# Patient Record
Sex: Female | Born: 1950 | Hispanic: No | Marital: Married | Smoking: Never smoker
Health system: Southern US, Community
[De-identification: ages and names within clinical notes are randomized; demographics above are authoritative.]

## PROBLEM LIST (undated history)

## (undated) DIAGNOSIS — E7800 Pure hypercholesterolemia, unspecified: Secondary | ICD-10-CM

## (undated) DIAGNOSIS — I10 Essential (primary) hypertension: Secondary | ICD-10-CM

---

## 1998-02-18 ENCOUNTER — Ambulatory Visit (HOSPITAL_COMMUNITY): Admission: RE | Admit: 1998-02-18 | Discharge: 1998-02-18 | Payer: Self-pay | Admitting: Cardiovascular Disease

## 2010-07-09 ENCOUNTER — Ambulatory Visit: Payer: Self-pay | Admitting: Obstetrics & Gynecology

## 2010-07-16 ENCOUNTER — Ambulatory Visit: Payer: Self-pay | Admitting: Obstetrics and Gynecology

## 2010-08-13 ENCOUNTER — Ambulatory Visit
Admission: RE | Admit: 2010-08-13 | Discharge: 2010-08-13 | Payer: Self-pay | Source: Home / Self Care | Attending: Obstetrics and Gynecology | Admitting: Obstetrics and Gynecology

## 2010-08-14 NOTE — Progress Notes (Unsigned)
NAME:  LITSY, EPTING NO.:  [id number]  MEDICAL RECORD NO.:  [id number]          PATIENT TYPE:  WOC  LOCATION:  [facility]                   FACILITY:  [facility]  PHYSICIAN:  Argentina Donovan, MD        DATE OF BIRTH:  Apr 05, 1951  DATE OF SERVICE:  08/13/2010                                 CLINIC NOTE  The patient is a 60 year old [place] lady speaks English, although there is an interpreter present, who was in 4 weeks ago with postmenopausal bleeding, had an IUD removed without incident and since then, she has had no bleeding, so were not going to do anything followup.  I think we will have her come back in 6 months for a followup exam, perhaps repeat ultrasound and then determine whether or not endometrial biopsy is necessary.  For the moment, I think we will hold off on doing that.  I see no reason to put the lady through this at this point.  IMPRESSION:  Postmenopausal bleeding prior to intrauterine device removal.          ______________________________ Argentina Donovan, MD    PR/MEDQ  D:  08/13/2010  T:  08/14/2010  Job:  [id number]

## 2011-07-31 ENCOUNTER — Other Ambulatory Visit: Payer: Self-pay

## 2011-07-31 ENCOUNTER — Emergency Department (HOSPITAL_BASED_OUTPATIENT_CLINIC_OR_DEPARTMENT_OTHER)
Admission: EM | Admit: 2011-07-31 | Discharge: 2011-07-31 | Disposition: A | Discharge status: Home / Self Care | Payer: Self-pay | Attending: Emergency Medicine | Admitting: Emergency Medicine

## 2011-07-31 ENCOUNTER — Encounter: Payer: Self-pay | Admitting: *Deleted

## 2011-07-31 ENCOUNTER — Emergency Department (INDEPENDENT_AMBULATORY_CARE_PROVIDER_SITE_OTHER): Payer: Self-pay

## 2011-07-31 DIAGNOSIS — R918 Other nonspecific abnormal finding of lung field: Secondary | ICD-10-CM

## 2011-07-31 DIAGNOSIS — R071 Chest pain on breathing: Secondary | ICD-10-CM | POA: Insufficient documentation

## 2011-07-31 DIAGNOSIS — R0789 Other chest pain: Secondary | ICD-10-CM

## 2011-07-31 DIAGNOSIS — E7800 Pure hypercholesterolemia, unspecified: Secondary | ICD-10-CM | POA: Insufficient documentation

## 2011-07-31 DIAGNOSIS — J45909 Unspecified asthma, uncomplicated: Secondary | ICD-10-CM | POA: Insufficient documentation

## 2011-07-31 DIAGNOSIS — E119 Type 2 diabetes mellitus without complications: Secondary | ICD-10-CM | POA: Insufficient documentation

## 2011-07-31 DIAGNOSIS — I10 Essential (primary) hypertension: Secondary | ICD-10-CM | POA: Insufficient documentation

## 2011-07-31 DIAGNOSIS — R079 Chest pain, unspecified: Secondary | ICD-10-CM

## 2011-07-31 DIAGNOSIS — Z79899 Other long term (current) drug therapy: Secondary | ICD-10-CM | POA: Insufficient documentation

## 2011-07-31 DIAGNOSIS — J40 Bronchitis, not specified as acute or chronic: Secondary | ICD-10-CM | POA: Insufficient documentation

## 2011-07-31 HISTORY — DX: Pure hypercholesterolemia, unspecified: E78.00

## 2011-07-31 HISTORY — DX: Essential (primary) hypertension: I10

## 2011-07-31 LAB — BASIC METABOLIC PANEL
Chloride: 97 mEq/L (ref 96–112)
Creatinine, Ser: 0.4 mg/dL — ABNORMAL LOW (ref 0.50–1.10)
GFR calc Af Amer: >90 mL/min (ref >90)
GFR calc non Af Amer: >90 mL/min (ref >90)
Potassium: 3.9 mEq/L (ref 3.5–5.1)

## 2011-07-31 LAB — CBC
MCHC: 31.8 g/dL (ref 30.0–36.0)
Platelets: 214 10*3/uL (ref 150–400)
RDW: 14.3 % (ref 11.5–15.5)
WBC: 10.9 10*3/uL — ABNORMAL HIGH (ref 4.0–10.5)

## 2011-07-31 LAB — CARDIAC PANEL(CRET KIN+CKTOT+MB+TROPI)
Relative Index: 2.1 (ref 0.0–2.5)
Total CK: 123 U/L (ref 7–177)

## 2011-07-31 MED ORDER — NAPROXEN 250 MG PO TABS
500.0000 mg | ORAL_TABLET | Freq: Once | ORAL | Status: AC
  Administered 2011-07-31: 500 mg via ORAL
  Filled 2011-07-31: qty 2

## 2011-07-31 MED ORDER — ALBUTEROL SULFATE HFA 108 (90 BASE) MCG/ACT IN AERS
2.0000 | INHALATION_SPRAY | RESPIRATORY_TRACT | Status: DC | PRN
  Administered 2011-07-31: 2 via RESPIRATORY_TRACT
  Filled 2011-07-31: qty 6.7

## 2011-07-31 MED ORDER — PROMETHAZINE-CODEINE 6.25-10 MG/5ML PO SYRP: 5.0000 mL | ORAL_SOLUTION | ORAL | Status: AC | PRN

## 2011-07-31 MED ORDER — NAPROXEN SODIUM 220 MG PO TABS: ORAL_TABLET | ORAL | Status: AC

## 2011-07-31 NOTE — ED Notes (Signed)
Pt presents to ED today with chest pressure that started about 1 week ago.  Pt describes as presssure and non-radiating in nature.

## 2011-07-31 NOTE — ED Provider Notes (Signed)
History     CSN: [id number]  Arrival date & time 07/31/11  0012   First MD Initiated Contact with Patient 07/31/11 [phone]      Chief Complaint  Patient presents with  . Chest Pain    (Consider location/radiation/quality/duration/timing/severity/associated sxs/prior treatment) HPI This is a 61 year old female with a one-week history of chest discomfort. The pain is mild to moderate, described as pressure, and is located in the left parasternal region. It is worse with movement and certain positions. It is worse with palpation. She has had some mild dyspnea over the past week. She did have cough earlier in the week but that has improved. She denies nausea, vomiting, diarrhea or abdominal pain. She denies fever. Her sugar was noted to be elevated but she states she did not take her evening Lantus dose. She has an appointment with her primary care physician the day after tomorrow. The only thing she is taking is 281 mg aspirin tablets, without relief.  Past Medical History  Diagnosis Date  . Diabetes mellitus   . Hypertension   . Asthma   . Hypercholesteremia     History reviewed. No pertinent past surgical history.  History reviewed. No pertinent family history.  History  Substance Use Topics  . Smoking status: Never Smoker   . Smokeless tobacco: Not on file  . Alcohol Use: No    OB History    Grav Para Term Preterm Abortions TAB SAB Ect Mult Living                  Review of Systems  All other systems reviewed and are negative.    Allergies  Review of patient's allergies indicates no known allergies.  Home Medications   Current Outpatient Rx  Name Route Sig Dispense Refill  . ALBUTEROL SULFATE HFA 108 (90 BASE) MCG/ACT IN AERS Inhalation Inhale 2 puffs into the lungs every 6 (six) hours as needed.      Marland Kitchen AMLODIPINE BESYLATE 10 MG PO TABS Oral Take 10 mg by mouth daily.      . DEXLANSOPRAZOLE 60 MG PO CPDR Oral Take 60 mg by mouth daily.      Marland Kitchen  FLUTICASONE-SALMETEROL 250-50 MCG/DOSE IN AEPB Inhalation Inhale 1 puff into the lungs every 12 (twelve) hours.      Marland Kitchen GLIMEPIRIDE 4 MG PO TABS Oral Take 4 mg by mouth daily before breakfast.      . MONTELUKAST SODIUM 10 MG PO TABS Oral Take 10 mg by mouth at bedtime.      Marland Kitchen OLMESARTAN MEDOXOMIL-HCTZ 20-12.5 MG PO TABS Oral Take 1 tablet by mouth daily.      Marland Kitchen SIMVASTATIN 20 MG PO TABS Oral Take 20 mg by mouth every evening.        BP 157/88  Pulse 79  Temp(Src) 97.5 F (36.4 C) (Oral)  Resp 19  SpO2 94%  Physical Exam General: Well-developed, well-nourished female in no acute distress; appearance consistent with age of record HENT: normocephalic, atraumatic Eyes: Normal per Neck: supple Heart: regular rate and rhythm; no murmurs Lungs: Faint expiratory wheezing in the bases; decreased air movement Chest: Left parasternal chest tenderness without crepitus or deformity Abdomen: soft; nondistended Extremities: No deformity; full range of motion; pulses normal Neurologic: Awake, alert; motor function intact in all extremities and symmetric; no facial droop Skin: Warm and dry Psychiatric: Normal mood and affect    ED Course  Procedures (including critical care time)    MDM   Nursing notes and vitals  signs, including pulse oximetry, reviewed.  Summary of this visit's results, reviewed by myself:  Labs:  Results for orders placed during the hospital encounter of 07/31/11  CBC      Component Value Range   WBC 10.9 (*) 4.0 - 10.5 (K/uL)   RBC 4.76  3.87 - 5.11 (MIL/uL)   Hemoglobin 12.3  12.0 - 15.0 (g/dL)   HCT 40.9  81.1 - 91.4 (%)   MCV 81.3  78.0 - 100.0 (fL)   MCH 25.8 (*) 26.0 - 34.0 (pg)   MCHC 31.8  30.0 - 36.0 (g/dL)   RDW 78.2  95.6 - 21.3 (%)   Platelets 214  150 - 400 (K/uL)  BASIC METABOLIC PANEL      Component Value Range   Sodium 135  135 - 145 (mEq/L)   Potassium 3.9  3.5 - 5.1 (mEq/L)   Chloride 97  96 - 112 (mEq/L)   CO2 25  19 - 32 (mEq/L)    Glucose, Bld 410 (*) 70 - 99 (mg/dL)   BUN 13  6 - 23 (mg/dL)   Creatinine, Ser 0.86 (*) 0.50 - 1.10 (mg/dL)   Calcium 9.6  8.4 - 57.8 (mg/dL)   GFR calc non Af Amer >90  >90 (mL/min)   GFR calc Af Amer >90  >90 (mL/min)  CARDIAC PANEL(CRET KIN+CKTOT+MB+TROPI)      Component Value Range   Total CK 123  7 - 177 (U/L)   CK, MB 2.6  0.3 - 4.0 (ng/mL)   Troponin I <0.30  <0.30 (ng/mL)   Relative Index 2.1  0.0 - 2.5     Imaging Studies: Dg Chest 2 View  07/31/2011  *RADIOLOGY REPORT*  Clinical Data: Chest pain/chest pressure.  CHEST - 2 VIEW  Comparison: None.  Findings: Hypoaeration results in mild interstitial and vascular crowding.  Eventration versus elevation right hemidiaphragm. Mild central peribronchial cuffing.  No focal areas of consolidation. No pleural effusion or pneumothorax.  Aortic arch atherosclerotic calcification.  Heart size upper normal limits.  No acute osseous abnormality.  IMPRESSION: Hypoaeration results in mild interstitial and vascular crowding.  Mild central peribronchial cuffing.  No focal areas of consolidation.  Original Report Authenticated By: Waneta Martins, M.D.   EKG Interpretation:  Date & Time: 07/31/2011 12:33 AM  Rate: 78  Rhythm: normal sinus rhythm  QRS Axis: right  Intervals: normal  ST/T Wave abnormalities: normal  Conduction Disutrbances:none  Narrative Interpretation: poor r-wave progression  Old EKG Reviewed: none available   2:49 AM We'll treat for bronchitis and chest wall pain. As noted she has an appointment with her PCP the day after tomorrow.             Hanley Seamen, MD 07/31/11 [phone]

## 2012-02-07 ENCOUNTER — Encounter: Payer: Self-pay | Admitting: Obstetrics & Gynecology

## 2012-03-13 ENCOUNTER — Ambulatory Visit (INDEPENDENT_AMBULATORY_CARE_PROVIDER_SITE_OTHER): Payer: Self-pay | Admitting: Obstetrics & Gynecology

## 2012-03-13 ENCOUNTER — Encounter: Payer: Self-pay | Admitting: Obstetrics & Gynecology

## 2012-03-13 VITALS — BP 109/63 | HR 63 | Temp 96.9°F | Ht 64.0 in | Wt 217.7 lb

## 2012-03-13 DIAGNOSIS — N898 Other specified noninflammatory disorders of vagina: Secondary | ICD-10-CM

## 2012-03-13 DIAGNOSIS — N950 Postmenopausal bleeding: Secondary | ICD-10-CM

## 2012-03-13 NOTE — Patient Instructions (Signed)
Postmenopausal Bleeding Menopause is commonly referred to as the "change in life." It is a time when the fertile years, the time of ovulating and having menstrual periods, has come to an end. It is also determined by not having menstrual periods for 12 months.  Postmenopausal bleeding is any bleeding a woman has after she has entered into menopause. Any type of postmenopausal bleeding, even if it appears to be a typical menstrual period, is concerning. This should be evaluated by your caregiver.  CAUSES   Hormone therapy.   Cancer of the cervix or cancer of the lining of the uterus (endometrial cancer).   Thinning of the uterine lining (uterine atrophy).   Thyroid diseases.   Certain medicines.   Infection of the uterus or cervix.   Inflammation or irritation of the uterine lining (endometritis).   Estrogen-secreting tumors.   Growths (polyps) on the cervix, uterine lining, or uterus.   Uterine tumors (fibroids).   Being very overweight (obese).  DIAGNOSIS  Your caregiver will take a medical history and ask questions. A physical exam will also be performed. Further tests may include:   A transvaginal ultrasound. An ultrasound wand or probe is inserted into your vagina to view the pelvic organs.   A biopsy of the lining of the uterus (endometrium). A sample of the endometrium is removed and examined.   A hysteroscopy. Your caregiver may use an instrument with a light and a camera attached to it (hysteroscope). The hysteroscope is used to look inside the uterus for problems.   A dilation and curettage (D&C). Tissue is removed from the uterine lining to be examined for problems.  TREATMENT  Treatment depends on the cause of the bleeding. Some treatments include:   Surgery.   Medicines.   Hormones.   A hysteroscopy or D&C to remove polyps or fibroids.   Changing or stopping a current medicine you are taking.  Talk to your caregiver about your specific treatment. HOME CARE  INSTRUCTIONS   Maintain a healthy weight.   Keep regular pelvic exams and Pap tests.  SEEK MEDICAL CARE IF:   You have bleeding, even if it is light in comparison to your previous periods.   Your bleeding lasts more than 1 week.   You have abdominal pain.   You develop bleeding with sexual intercourse.  SEEK IMMEDIATE MEDICAL CARE IF:   You have a fever, chills, headache, dizziness, muscle aches, and bleeding.   You have severe pain with bleeding.   You are passing blood clots.   You have bleeding and need more than 1 pad an hour.   You feel faint.  MAKE SURE YOU:  Understand these instructions.   Will watch your condition.   Will get help right away if you are not doing well or get worse.  Document Released: 10/20/2005 Document Revised: 07/01/2011 Document Reviewed: 03/18/2011 Exit[employer] Patient Information 2012 [employer]. 

## 2012-03-13 NOTE — Progress Notes (Signed)
Patient ID: Veronica Hudson, female   DOB: 12-Mar-1951, 61 y.o.   MRN: [id number]  Chief Complaint  Patient presents with  . Vaginal Bleeding    postmenopausal bleeding; received shot for asthma and spotted x 1 day,.  Occurance happened where she spotted for 2 days.  Pt states sometimes feels movement in her belly "like a baby moving"    HPI Veronica Hudson is a 61 y.o. female.  Patient is postmenopausal. She had an IUD removed by Dr. Okey Dupre January 2012. She's occasionally had spotting. She's also noted vaginal dryness and irritation and a yellow vaginal discharge. HPI  Past Medical History  Diagnosis Date  . Diabetes mellitus   . Hypertension   . Asthma   . Hypercholesteremia     No past surgical history on file.  No family history on file.  Social History History  Substance Use Topics  . Smoking status: Never Smoker   . Smokeless tobacco: Never Used  . Alcohol Use: No    No Known Allergies  Current Outpatient Prescriptions  Medication Sig Dispense Refill  . albuterol (PROVENTIL HFA;VENTOLIN HFA) 108 (90 BASE) MCG/ACT inhaler Inhale 2 puffs into the lungs every 6 (six) hours as needed.        Marland Kitchen amLODipine (NORVASC) 10 MG tablet Take 10 mg by mouth daily.        . benzonatate (TESSALON) 100 MG capsule Take 100 mg by mouth every 4 (four) hours as needed.      Marland Kitchen dexlansoprazole (DEXILANT) 60 MG capsule Take 60 mg by mouth daily.        . Fluticasone-Salmeterol (ADVAIR) 250-50 MCG/DOSE AEPB Inhale 1 puff into the lungs every 12 (twelve) hours.        . insulin aspart (NOVOLOG) 100 UNIT/ML injection Inject into the skin 3 (three) times daily before meals. 18 units am/lunch 20units for supper      . insulin glargine (LANTUS) 100 UNIT/ML injection Inject 120 Units into the skin at bedtime.      Marland Kitchen METFORMIN HCL PO Take by mouth daily.      . montelukast (SINGULAIR) 10 MG tablet Take 10 mg by mouth at bedtime.        . naproxen sodium (ALEVE) 220 MG tablet Take 2 tablets every 12 hours  as needed for chest wall pain.      Marland Kitchen olmesartan-hydrochlorothiazide (BENICAR HCT) 20-12.5 MG per tablet Take 1 tablet by mouth daily.        . simvastatin (ZOCOR) 20 MG tablet Take 20 mg by mouth every evening.        . Tiotropium Bromide Monohydrate (SPIRIVA HANDIHALER IN) Inhale 2 puffs into the lungs as needed.      Marland Kitchen glimepiride (AMARYL) 4 MG tablet Take 4 mg by mouth daily before breakfast.          Review of Systems Review of Systems  Constitutional: Negative for appetite change and unexpected weight change.  Gastrointestinal: Negative for nausea, diarrhea, constipation, blood in stool and rectal pain.  Genitourinary: Positive for vaginal bleeding (slight spot) and vaginal discharge (Yellow). Negative for difficulty urinating and pelvic pain.    Blood pressure 109/63, pulse 63, temperature 96.9 F (36.1 C), temperature source Oral, height 5\' 4"  (1.626 m), weight 217 lb 11.2 oz (98.748 kg).  Physical Exam Physical Exam  Constitutional: No distress.       Moderately obese  Pulmonary/Chest: Effort normal. No respiratory distress.  Abdominal: Soft. She exhibits no distension and no mass. There is no  tenderness.  Genitourinary: Uterus normal. Vaginal discharge found.       Vaginal atrophy noted with a slight yellowish discharge No pelvic masses  Skin: Skin is warm and dry.  Psychiatric: She has a normal mood and affect. Her behavior is normal.    Data Reviewed Previous office notes  Assessment    At postmenopausal vaginal spotting. Vaginal atrophy. Wet prep was sent today.    Plan    Followup on a wet prep results. Pelvic ultrasound ordered. She will return after the results are completed.       ARNOLD,JAMES 03/13/2012, 4:12 PM

## 2012-03-14 ENCOUNTER — Other Ambulatory Visit: Payer: Self-pay | Admitting: Obstetrics & Gynecology

## 2012-03-14 LAB — WET PREP, GENITAL

## 2012-03-14 MED ORDER — METRONIDAZOLE 500 MG PO TABS: 500.0000 mg | ORAL_TABLET | Freq: Two times a day (BID) | ORAL | Status: AC

## 2012-03-14 NOTE — Addendum Note (Signed)
Addended by: Freddi Starr on: 03/14/2012 11:05 AM   Modules accepted: Orders

## 2012-03-14 NOTE — Progress Notes (Signed)
Patient informed of wet prep results. Will send flagyl to [facility] on [address] in [place].

## 2012-03-16 ENCOUNTER — Ambulatory Visit (HOSPITAL_COMMUNITY)
Admission: RE | Admit: 2012-03-16 | Discharge: 2012-03-16 | Disposition: A | Discharge status: Home / Self Care | Payer: Self-pay | Source: Ambulatory Visit | Attending: Obstetrics & Gynecology | Admitting: Obstetrics & Gynecology

## 2012-03-16 ENCOUNTER — Ambulatory Visit (HOSPITAL_COMMUNITY): Payer: Self-pay

## 2012-03-16 DIAGNOSIS — N950 Postmenopausal bleeding: Secondary | ICD-10-CM

## 2012-03-16 DIAGNOSIS — N838 Other noninflammatory disorders of ovary, fallopian tube and broad ligament: Secondary | ICD-10-CM | POA: Insufficient documentation

## 2012-03-16 DIAGNOSIS — R9389 Abnormal findings on diagnostic imaging of other specified body structures: Secondary | ICD-10-CM | POA: Insufficient documentation

## 2012-03-20 ENCOUNTER — Encounter: Payer: Self-pay | Admitting: Obstetrics & Gynecology

## 2012-03-20 ENCOUNTER — Encounter: Payer: Self-pay | Admitting: *Deleted

## 2012-03-20 ENCOUNTER — Telehealth: Payer: Self-pay

## 2012-03-20 NOTE — Telephone Encounter (Signed)
Called pt and was unable to leave message due to phone # being disconnected.  Called pt's work # and they did recognize her name.  Will send to [facility] to send a certified letter.

## 2012-03-20 NOTE — Telephone Encounter (Signed)
Message copied by Faythe Casa on Mon Mar 20, 2012  8:47 AM ------      Message from: Adam Phenix      Created: Caleen Essex Mar 17, 2012  1:15 PM       Endometrial bx at return visit

## 2012-04-06 ENCOUNTER — Ambulatory Visit (INDEPENDENT_AMBULATORY_CARE_PROVIDER_SITE_OTHER): Payer: Self-pay | Admitting: Obstetrics & Gynecology

## 2012-04-06 ENCOUNTER — Other Ambulatory Visit (HOSPITAL_COMMUNITY)
Admission: RE | Admit: 2012-04-06 | Discharge: 2012-04-06 | Disposition: A | Discharge status: Home / Self Care | Payer: Self-pay | Source: Ambulatory Visit | Attending: Obstetrics & Gynecology | Admitting: Obstetrics & Gynecology

## 2012-04-06 ENCOUNTER — Encounter: Payer: Self-pay | Admitting: Obstetrics & Gynecology

## 2012-04-06 VITALS — BP 131/84 | HR 73 | Temp 96.8°F | Ht 64.0 in | Wt 222.2 lb

## 2012-04-06 DIAGNOSIS — N950 Postmenopausal bleeding: Secondary | ICD-10-CM | POA: Insufficient documentation

## 2012-04-06 DIAGNOSIS — R9389 Abnormal findings on diagnostic imaging of other specified body structures: Secondary | ICD-10-CM

## 2012-04-06 NOTE — Patient Instructions (Signed)
Postmenopausal Bleeding Menopause is when a woman's period stops. It is also not having periods for a total of 12 months. Postmenopausal bleeding is any bleeding after menopause. Any type of bleeding after menopause is concerning. Talk to your doctor about this. You may need medicine, hormones, surgery, or a procedure to remove tumors or growths. HOME CARE  Stay at a healthy weight.   Keep regular pelvic exams and Pap tests.  GET HELP RIGHT AWAY IF:   You have a fever.   You have chills, a headache, dizziness, muscle aches, and bleeding.   You have pain with bleeding.   You have clumps of blood (blood clots) coming from your vagina.   You have bleeding and need more than 1 pad an hour.   You feel like you are going to pass out (faint).   You have more bleeding than when you were having periods.   Your bleeding lasts for more than 1 week.   You have belly (abdominal) pain.   You have bleeding after sex (intercourse).  MAKE SURE YOU:  Understand these instructions.   Will watch your condition.   Will get help right away if you are not doing well or get worse.  Document Released: 04/20/2008 Document Revised: 07/01/2011 Document Reviewed: 03/18/2011 ExitCare Patient Information 2012 [employer]. 

## 2012-04-06 NOTE — Progress Notes (Signed)
Subjective:     Patient ID: Veronica Hudson, female   DOB: [birth year], 61 y.o.   MRN: [id number]  HPI  Pt was seen in July for postmenopausal bleeding.  Sx have not returned however pt had a sonogram in Aug 2013 which showed a thickened endometrium and pt was asked to f/u for endobx.     Review of Systems No weight loss.      Objective:   Physical ExamBP 131/84  Pulse 73  Temp 96.8 F (36 C) (Oral)  Ht 5\' 4"  (1.626 m)  Wt 222 lb 3.2 oz (100.789 kg)  BMI 38.14 kg/m2  The indications for endometrial biopsy were reviewed.   Risks of the biopsy including cramping, bleeding, infection, uterine perforation, inadequate specimen and need for additional procedures  were discussed. The patient states she understands and agrees to undergo procedure today. Consent was signed. Time out was performed. Urine HCG was negative. A sterile speculum was placed in the patient's vagina and the cervix was prepped with Betadine. A single-toothed tenaculum was placed on the anterior lip of the cervix to stabilize it. The 3 mm pipelle was introduced into the endometrial cavity without difficulty to a depth of 7cm, and a minimal amount of tissue was obtained and sent to pathology. The instruments were removed from the patient's vagina. Minimal bleeding from the cervix was noted. The patient tolerated the procedure well.there were no complications. Routine post-procedure instructions were given to the patient. The patient will follow up to review the results and for further management.     02/2012 TRANSABDOMINAL AND TRANSVAGINAL ULTRASOUND OF PELVIS  Technique: Both transabdominal and transvaginal ultrasound  examinations of the pelvis were performed. Transabdominal technique  was performed for global imaging of the pelvis including uterus,  ovaries, adnexal regions, and pelvic cul-de-sac.  It was necessary to proceed with endovaginal exam following the  transabdominal exam to visualize the uterus, ovaries, and adnexa  .  Comparison: None  Findings:  Uterus: 6.2 x 3.9 x 4.8 cm.  Endometrium: Relatively homogeneously thickened. Maximally 2.2 cm.  Right ovary: 1.7 x 0.7 x 2.2 cm. Normal in morphology.  Left ovary: 3.4 x 2.3 x 1.9 cm. Possible residual follicle at 1.4  cm.  Both ovaries poorly visualized secondary to patient body habitus.  Other findings: No free fluid  IMPRESSION:  Endometrial thickening. In this patient with post menopausal  bleeding, considerations include endometrial hyperplasia or  carcinoma. Sonohysterogram or tissue sampling should be  considered.  Decreased sensitivity and specificity exam due to technique related  factors, as described above.  Original Report Authenticated By: Consuello Bossier, M.D.       Assessment:     H/o postmenopausal bleeding with thickened endometrium on sono s/p endometrial bx    Plan:     F/u results of Endo bx F/u for weeks to review results       Emmert Roethler L. Harraway-Smith, M.D., [place] [place]

## 2012-04-25 ENCOUNTER — Telehealth: Payer: Self-pay | Admitting: *Deleted

## 2012-04-25 DIAGNOSIS — N950 Postmenopausal bleeding: Secondary | ICD-10-CM

## 2012-04-25 NOTE — Telephone Encounter (Signed)
Attempted to contact patient. LM to return call to clinic. Pt needs to be informed that results were normal and she will need follow-up in ~ 2 weeks. I will send a message to the admin pool to schedule her appointment.

## 2012-04-25 NOTE — Telephone Encounter (Signed)
Message left by pt's husband stating that she had uterine biopsy on 04/06/12. They are still waiting for the results. Please call back.

## 2012-04-25 NOTE — Telephone Encounter (Signed)
Reviewed patient chart. Results are final and normal but have not been reviewed by Dr. Erin Fulling. Patient does not have follow-up scheduled. Will send message to Dr. Erin Fulling to ask for results review and follow-up recommendations.

## 2012-04-26 NOTE — Telephone Encounter (Signed)
Note from Dr. Erin Fulling  Please schedule pt repeat pelvic sono.  Needs office visit with me.  If endometrium continues to be thickened need hysteroscopy.         Thx,    clh-S

## 2012-05-01 NOTE — Telephone Encounter (Signed)
Scheduled follow up US for 05/11/12 @ 245 pm.   Called pt and pt gave me permission to inform husband of results.  I informed him that her biopsy results are normal but she does have an [place] scheduled in which I gave above appt time and also informed him of her follow up appt here at the clinics for 05/19/12 @ 10am.  Pt's husband stated understanding and had no further questions.

## 2012-05-11 ENCOUNTER — Ambulatory Visit (HOSPITAL_COMMUNITY)
Admission: RE | Admit: 2012-05-11 | Discharge: 2012-05-11 | Disposition: A | Discharge status: Home / Self Care | Payer: Self-pay | Source: Ambulatory Visit | Attending: Obstetrics & Gynecology | Admitting: Obstetrics & Gynecology

## 2012-05-11 DIAGNOSIS — N950 Postmenopausal bleeding: Secondary | ICD-10-CM | POA: Insufficient documentation

## 2012-05-19 ENCOUNTER — Encounter: Payer: Self-pay | Admitting: Obstetrics & Gynecology

## 2012-05-19 ENCOUNTER — Ambulatory Visit (INDEPENDENT_AMBULATORY_CARE_PROVIDER_SITE_OTHER): Payer: Self-pay | Admitting: Obstetrics & Gynecology

## 2012-05-19 VITALS — BP 140/90 | HR 85 | Temp 96.7°F | Ht 64.0 in | Wt 226.3 lb

## 2012-05-19 DIAGNOSIS — N949 Unspecified condition associated with female genital organs and menstrual cycle: Secondary | ICD-10-CM

## 2012-05-19 DIAGNOSIS — N938 Other specified abnormal uterine and vaginal bleeding: Secondary | ICD-10-CM | POA: Insufficient documentation

## 2012-05-19 DIAGNOSIS — J069 Acute upper respiratory infection, unspecified: Secondary | ICD-10-CM

## 2012-05-19 MED ORDER — AZITHROMYCIN 250 MG PO TABS: 250.0000 mg | ORAL_TABLET | ORAL | Status: DC

## 2012-05-19 NOTE — Patient Instructions (Addendum)
Dysfunctional Uterine Bleeding  Normally, menstrual periods begin between ages 11 to 17 in young women. A normal menstrual cycle/period may begin every 23 days up to 35 days and lasts from 1 to 7 days. Around 12 to 14 days before your menstrual period starts, ovulation (ovary produces an egg) occurs. When counting the time between menstrual periods, count from the first day of bleeding of the previous period to the first day of bleeding of the next period.  Dysfunctional (abnormal) uterine bleeding is bleeding that is different from a normal menstrual period. Your periods may come earlier or later than usual. They may be lighter, have blood clots or be heavier. You may have bleeding between periods, or you may skip one period or more. You may have bleeding after sexual intercourse, bleeding after menopause, or no menstrual period.  CAUSES   · Pregnancy (normal, miscarriage, tubal).  · IUDs (intrauterine device, birth control).  · Birth control pills.  · Hormone treatment.  · Menopause.  · Infection of the cervix.  · Blood clotting problems.  · Infection of the inside lining of the uterus.  · Endometriosis, inside lining of the uterus growing in the pelvis and other female organs.  · Adhesions (scar tissue) inside the uterus.  · Obesity or severe weight loss.  · Uterine polyps inside the uterus.  · Cancer of the vagina, cervix, or uterus.  · Ovarian cysts or polycystic ovary syndrome.  · Medical problems (diabetes, thyroid disease).  · Uterine fibroids (noncancerous tumor).  · Problems with your female hormones.  · Endometrial hyperplasia, very thick lining and enlarged cells inside of the uterus.  · Medicines that interfere with ovulation.  · Radiation to the pelvis or abdomen.  · Chemotherapy.  DIAGNOSIS   · Your doctor will discuss the history of your menstrual periods, medicines you are taking, changes in your weight, stress in your life, and any medical problems you may have.  · Your doctor will do a physical  and pelvic examination.  · Your doctor may want to perform certain tests to make a diagnosis, such as:  · Pap test.  · Blood tests.  · Cultures for infection.  · CT scan.  · Ultrasound.  · Hysteroscopy.  · Laparoscopy.  · MRI.  · Hysterosalpingography.  · D and C.  · Endometrial biopsy.  TREATMENT   Treatment will depend on the cause of the dysfunctional uterine bleeding (DUB). Treatment may include:  · Observing your menstrual periods for a couple of months.  · Prescribing medicines for medical problems, including:  · Antibiotics.  · Hormones.  · Birth control pills.  · Removing an IUD (intrauterine device, birth control).  · Surgery:  · D and C (scrape and remove tissue from inside the uterus).  · Laparoscopy (examine inside the abdomen with a lighted tube).  · Uterine ablation (destroy lining of the uterus with electrical current, laser, heat, or freezing).  · Hysteroscopy (examine cervix and uterus with a lighted tube).  · Hysterectomy (remove the uterus).  HOME CARE INSTRUCTIONS   · If medicines were prescribed, take exactly as directed. Do not change or switch medicines without consulting your caregiver.  · Long term heavy bleeding may result in iron deficiency. Your caregiver may have prescribed iron pills. They help replace the iron that your body lost from heavy bleeding. Take exactly as directed.  · Do not take aspirin or medicines that contain aspirin one week before or during your menstrual period. Aspirin may make   the bleeding worse.  · If you need to change your sanitary pad or tampon more than once every 2 hours, stay in bed with your feet elevated and a cold pack on your lower abdomen. Rest as much as possible, until the bleeding stops or slows down.  · Eat well-balanced meals. Eat foods high in iron. Examples are:  · Leafy green vegetables.  · Whole-grain breads and cereals.  · Eggs.  · Meat.  · Liver.  · Do not try to lose weight until the abnormal bleeding has stopped and your blood iron level is  back to normal. Do not lift more than ten pounds or do strenuous activities when you are bleeding.  · For a couple of months, make note on your calendar, marking the start and ending of your period, and the type of bleeding (light, medium, heavy, spotting, clots or missed periods). This is for your caregiver to better evaluate your problem.  SEEK MEDICAL CARE IF:   · You develop nausea (feeling sick to your stomach) and vomiting, dizziness, or diarrhea while you are taking your medicine.  · You are getting lightheaded or weak.  · You have any problems that may be related to the medicine you are taking.  · You develop pain with your DUB.  · You want to remove your IUD.  · You want to stop or change your birth control pills or hormones.  · You have any type of abnormal bleeding mentioned above.  · You are over 16 years old and have not had a menstrual period yet.  · You are 61 years old and you are still having menstrual periods.  · You have any of the symptoms mentioned above.  · You develop a rash.  SEEK IMMEDIATE MEDICAL CARE IF:   · An oral temperature above 102° F (38.9° C) develops.  · You develop chills.  · You are changing your sanitary pad or tampon more than once an hour.  · You develop abdominal pain.  · You pass out or faint.  Document Released: 07/09/2000 Document Revised: 10/04/2011 Document Reviewed: 06/10/2009  ExitCare® Patient Information ©2013 [employer].

## 2012-05-19 NOTE — Progress Notes (Signed)
Subjective:     Patient ID: Veronica Hudson, female   DOB: [birth year], 61 y.o.   MRN: [id number]  HPI Pt with no complaints presents for f/u.  Pt with a h/o post menopausal bleeding.  Pt with neg endobx but, has thickened endometrium on sono.  Pt also c/o URI sx.  Pt reports that these sx- cough, nasal congestion and malaise always leads to pneumonia.  Has appt with her primary care physician on Nov 1st.  Want antibiotic for sx.           Review of Systems     Objective:   Physical ExamBP 140/90  Pulse 85  Temp 96.7 F (35.9 C) (Oral)  Ht 5\' 4"  (1.626 m)  Wt 226 lb 4.8 oz (102.649 kg)  BMI 38.84 kg/m2 HEENT: congested.  Ears: normal no fluid No sinus tenderness. Lungs; course BS, mild ex wheezes CV: RRR sono 03/16/12 TRANSABDOMINAL AND TRANSVAGINAL ULTRASOUND OF PELVIS  Technique: Both transabdominal and transvaginal ultrasound  examinations of the pelvis were performed. Transabdominal technique  was performed for global imaging of the pelvis including uterus,  ovaries, adnexal regions, and pelvic cul-de-sac.  It was necessary to proceed with endovaginal exam following the  transabdominal exam to visualize the uterus, ovaries, and adnexa .  Comparison: None  Findings:  Uterus: 6.2 x 3.9 x 4.8 cm.  Endometrium: Relatively homogeneously thickened. Maximally 2.2 cm.  Right ovary: 1.7 x 0.7 x 2.2 cm. Normal in morphology.  Left ovary: 3.4 x 2.3 x 1.9 cm. Possible residual follicle at 1.4  cm.  Both ovaries poorly visualized secondary to patient body habitus.  Other findings: No free fluid  IMPRESSION:  Endometrial thickening. In this patient with post menopausal  bleeding, considerations include endometrial hyperplasia or  carcinoma. Sonohysterogram or tissue sampling should be  considered.  Decreased sensitivity and specificity exam due to technique related  factors, as described above.   04/07/12 Diagnosis Endometrium, biopsy - BENIGN WEAKLY PROLIFERATIVE ENDOMETRIUM, NO  ATYPIA, HYPERPLASIA OR MALIGNANCY  sono 05/11/12 TRANSVAGINAL ULTRASOUND OF PELVIS  Technique: Transvaginal ultrasound examination of the pelvis was  performed including evaluation of the uterus, ovaries, adnexal  regions, and pelvic cul-de-sac.  Comparison: 03/16/2012  Findings:  Uterus: Is anteverted and anteflexed and demonstrates a sagittal  length of 7.2 cm, AP depth of 4.1 cm and width of 4.8 cm. No focal  mural abnormality is noted  Endometrium: Is thickened with a width of 2 cm. Some central  vascularity from a solitary vessel is noted with color Doppler and  may indicate the presence of an underlying polyp.  Right ovary: Has a normal postmenopausal appearance measuring 2.0 x  1.1 x 2.7 cm  Left ovary: Has a normal postmenopausal appearance measuring 1.9 x  1.3 x 1.4 cm  Other Findings: No pelvic fluid or separate adnexal masses are  seen.  IMPRESSION:  Persistent abnormal endometrial thickening is noted. The degree of  thickening seen would be abnormal in a postmenopausal patient  regardless of whether vaginal bleeding is present are not. Color  Doppler assessment demonstrates a feeding vessel into the area of  endometrial thickening and given the negative biopsy results, may  indicate the presence of a focal polyp or other focal lesion.  Endometrial carcinoma remains a consideration with this appearance.  Sonohysterogram should be considered for focal lesion work-up prior  to hysteroscopy. (Ref: Radiological Reasoning: Algorithmic Workup  of Abnormal Vaginal Bleeding with Endovaginal Sonography and  Sonohysterography. AJR 2008; 213:Y86-57)  Assessment:     PMPB- Thickened endometrium despite neg endobx.  Need further eval     Plan:     Hysteroscopy with D&C to be scheduled Z-pack for URI sx Needs surgical clearance from primary care physician  Jabaree Mercado L. Harraway-Smith, M.D., [place] [place]

## 2012-06-02 ENCOUNTER — Telehealth: Payer: Self-pay

## 2012-06-02 NOTE — Telephone Encounter (Signed)
Message copied by Faythe Casa on Fri Jun 02, 2012  8:45 AM ------      Message from: Darrel Hoover      Created: Wed May 31, 2012 12:05 PM      Regarding: Please call       Please call this patient.  She was to be seen by her primary care MD for surgery clearance on 05/26/12.  Her primary MD is not in the system.  We will need to confirm the patient was seen and ask her to call their office and have them fax [place] office notes BEFORE we can schedule her surgery.      Thanks,      Tresa Endo            Please let me know the outcome.            ----- Message -----         From: [place] L Presnell         Sent: 05/31/2012  11:46 AM           To: Juliette Mangle, RN, #            Tresa Endo,  Any update on this patient?  Holding surgery for medical clearance, had an appt w/primary care phy on 05/26/12.......      Thanks            ----- Message -----         From: Pennie Banter, PA         Sent: 05/22/2012  10:32 AM           To: [place] L Presnell                        ----- Message -----         From: Willodean Rosenthal, MD         Sent: 05/19/2012  11:04 AM           To: Pennie Banter, PA            Please schedule hysteroscopy with D&C.            Thx,      clh-S

## 2012-06-02 NOTE — Telephone Encounter (Signed)
Called pt with [employer] # [phone] and left message that we wanted to verify that she went to her appt at [facility] for surgical clearance and if she has to please have that office fax [place] the clearance to please call the clinics if she has any questions cause we need that clearance before her surgery can be scheduled.

## 2012-06-07 ENCOUNTER — Encounter: Payer: Self-pay | Admitting: *Deleted

## 2012-06-07 NOTE — Telephone Encounter (Signed)
Called patient with [employer]- no answer and unable to leave a message as [employer] heard a message the mailbox was full.   Will need to send letter

## 2012-06-07 NOTE — Telephone Encounter (Signed)
Prepared and sent certified letter since we have been unable to reach patient.   Will await a call from her or call her again.

## 2012-06-07 NOTE — Telephone Encounter (Signed)
We are attempting to reach patient

## 2012-06-08 ENCOUNTER — Encounter: Payer: Self-pay | Admitting: *Deleted

## 2012-06-08 NOTE — Telephone Encounter (Signed)
I received notes from patients visit on 11/1. The note does not give clearance. Pt is returning to see her pcp on 11/18 to followup. Requested that medical clearance be given at this visit, and a copy of note sent to [place].

## 2012-06-08 NOTE — Telephone Encounter (Signed)
I called [facility] in [facility]. Pt was seen on 05/26/12. The receptionist is not sure if it was for surgical clearance but she will fax the visit note from that day. She is also scheduled to be seen there again on 11/18.

## 2012-06-13 ENCOUNTER — Telehealth: Payer: Self-pay | Admitting: Medical

## 2012-06-13 NOTE — Telephone Encounter (Signed)
LM for patient to return call to clinic. Need information from PCP about medical clearance appointment from 06/12/12. No appointment in epic. Will need ROI and PCP information to obtain records.

## 2012-06-13 NOTE — Telephone Encounter (Signed)
Message copied by Freddi Starr on Tue Jun 13, 2012  9:51 AM ------      Message from: Darrel Hoover      Created: Tue Jun 13, 2012  9:05 AM      Regarding: Please call       Please call this patient.  Had primary care appointment on 06/12/12 for surgical clearance.  Need notes before surgery can be scheduled.  Please let me know once notes received.      Thanks,      Tresa Endo      ----- Message -----         From: [place] L Presnell         Sent: 05/31/2012  11:46 AM           To: Juliette Mangle, RN, #            Tresa Endo,  Any update on this patient?  Holding surgery for medical clearance, had an appt w/primary care phy on 05/26/12.......      Thanks            ----- Message -----         From: Pennie Banter, PA         Sent: 05/22/2012  10:32 AM           To: [place] L Presnell                        ----- Message -----         From: Willodean Rosenthal, MD         Sent: 05/19/2012  11:04 AM           To: Pennie Banter, PA            Please schedule hysteroscopy with D&C.            Thx,      clh-S

## 2012-06-14 NOTE — Telephone Encounter (Signed)
Called pt and left message to return the call to the clinics.  

## 2012-06-19 NOTE — Telephone Encounter (Signed)
I spoke with Dr. Ledell Peoples nurse and asked that she get him to write a note stating if or if not the patient is cleared for surgery. She stated that she will talk with him and fax it to [place].

## 2012-06-19 NOTE — Telephone Encounter (Signed)
Called [facility] and requested that they fax [place] the visit note from this visit. Receptionist states she will send it over today.

## 2012-06-20 NOTE — Telephone Encounter (Signed)
Letter from Dr. Clovis Riley approving surgical clearance received today.  Copy under media tab.

## 2012-06-21 ENCOUNTER — Telehealth: Payer: Self-pay | Admitting: *Deleted

## 2012-06-21 NOTE — Telephone Encounter (Addendum)
Called pt to notify her of surgery date and left message that I was calling with information and we will call her back on Monday 12/3  1020- spoke w/pt re: her surgery date. She requested to have surgery after 07/19/12 because her husband is out of town until that time due to a sick family member. She also had questions about financial responsibility as she has no insurance. I told pt that I will have her surgery date changed and she should complete an application for financial assistance ASAP.  Pt voiced understanding and also asked me to explain to her son about the financial assistance application. I spoke w/him and recommended that he obtain the papers from our clinic and return ASAP because the application process can take several weeks.  He agreed and voiced understanding.

## 2012-06-21 NOTE — Telephone Encounter (Signed)
Letter for surgical clearance has been received and scanned. Message sent to Veronica Hudson so that surgery can be posted.

## 2012-06-21 NOTE — Telephone Encounter (Signed)
Message copied by Jill Side on Wed Jun 21, 2012  4:05 PM ------      Message from: PRESNELL, [place] L      Created: Wed Jun 21, 2012  3:49 PM       Posted for 07/04/12 @ 12:30p w/ Dr. Mendel Ryder..............thanks!            ----- Message -----         From: Drucilla Schmidt Naiomi Musto, RN         Sent: 06/21/2012   3:18 PM           To: Juliette Mangle, RN, [place] L Presnell            This pt may be scheduled for surgery. The clearance letter has been scanned under media tab.

## 2012-06-24 ENCOUNTER — Encounter (HOSPITAL_COMMUNITY): Payer: Self-pay | Admitting: Pharmacist

## 2012-08-07 ENCOUNTER — Encounter (HOSPITAL_COMMUNITY): Admission: RE | Payer: Self-pay | Source: Ambulatory Visit

## 2012-08-07 ENCOUNTER — Ambulatory Visit (HOSPITAL_COMMUNITY): Admission: RE | Admit: 2012-08-07 | Payer: Self-pay | Source: Ambulatory Visit | Admitting: Obstetrics & Gynecology

## 2012-08-07 ENCOUNTER — Encounter (HOSPITAL_COMMUNITY): Payer: Self-pay | Admitting: Anesthesiology

## 2012-08-07 SURGERY — DILATATION AND CURETTAGE /HYSTEROSCOPY
Anesthesia: Choice | Site: Vagina

## 2012-08-30 ENCOUNTER — Telehealth: Payer: Self-pay | Admitting: General Practice

## 2012-08-30 NOTE — Telephone Encounter (Signed)
A man called stating he was calling on behalf of the patient and that the patient had a d&c done and they want to reschedule the appt.

## 2012-09-04 NOTE — Telephone Encounter (Signed)
Spoke with Dr Erin Fulling about this patient- patient no showed for 1/13 surgery and no one has been able to contact patient despite numerous phone calls. Dr Erin Fulling said this patient needs to come back in to the clinic to be seen by her before surgery can be rescheduled

## 2012-09-06 NOTE — Telephone Encounter (Signed)
Called pt and pt gave me permission to speak to husband due to her [place].  I clarified with pt's husband that they had a surgery scheduled for 1/13 and no one has been able to contact the pt.  Pt's husband stated they could go to procedure cause he was out of the country but he is back now. I informed him that per Dr. Erin Fulling pt needs to be seen bu her in the office.  Pt's husband stated understanding.  I transferred call to the front desk to schedule appt.

## 2012-09-21 ENCOUNTER — Ambulatory Visit: Payer: Self-pay | Admitting: Obstetrics & Gynecology

## 2012-09-27 ENCOUNTER — Ambulatory Visit (INDEPENDENT_AMBULATORY_CARE_PROVIDER_SITE_OTHER): Payer: Self-pay | Admitting: Obstetrics & Gynecology

## 2012-09-27 DIAGNOSIS — R9389 Abnormal findings on diagnostic imaging of other specified body structures: Secondary | ICD-10-CM

## 2012-09-27 NOTE — Progress Notes (Signed)
  Subjective:    Patient ID: Veronica Hudson, female    DOB: 12-06-1950, 62 y.o.   MRN: [id number]  HPI  Veronica Hudson says (through an interpretor) that she did see her Internal Medicine (Dr. Clovis Riley in [facility]) doctor, but from our conversation, it sounds as if she may have been there for a URI instead of for surgical clearance.   Review of Systems     Objective:   Physical Exam        Assessment & Plan:  Thickened endometrium- she will need a d&c, hysteroscopy She will need surgical clearance first. We obtained a ROI and will await his records.

## 2012-10-09 ENCOUNTER — Encounter: Payer: Self-pay | Admitting: *Deleted

## 2012-10-11 ENCOUNTER — Encounter: Payer: Self-pay | Admitting: *Deleted

## 2012-10-14 IMAGING — US US TRANSVAGINAL NON-OB
1 series · 14 of 25 positions shown · non-contrast
Comparison: None

CLINICAL DATA: Post menopausal bleeding

TRANSABDOMINAL AND TRANSVAGINAL ULTRASOUND OF PELVIS
TECHNIQUE: Both transabdominal and transvaginal ultrasound
examinations of the pelvis were performed. Transabdominal technique
was performed for global imaging of the pelvis including uterus,
ovaries, adnexal regions, and pelvic cul-de-sac.
It was necessary to proceed with endovaginal exam following the
transabdominal exam to visualize the uterus, ovaries, and adnexa  .

[Series 1: us transvaginal non-ob · 14 of 43 slices shown]
[im 1/43]
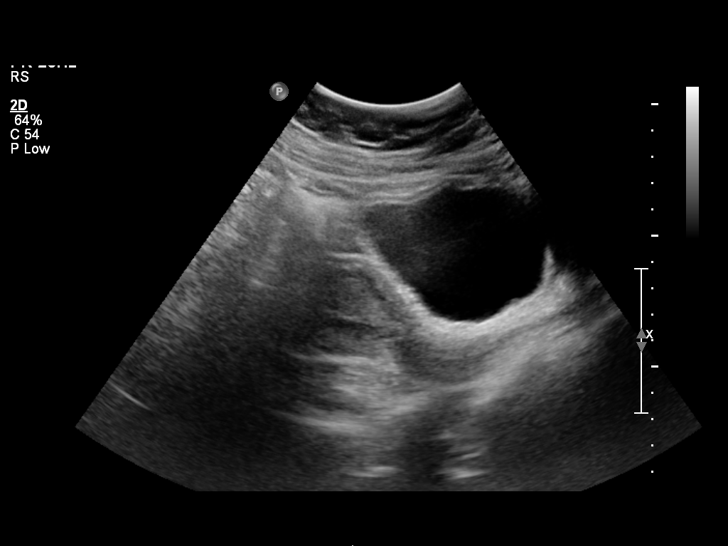
[im 4/43]
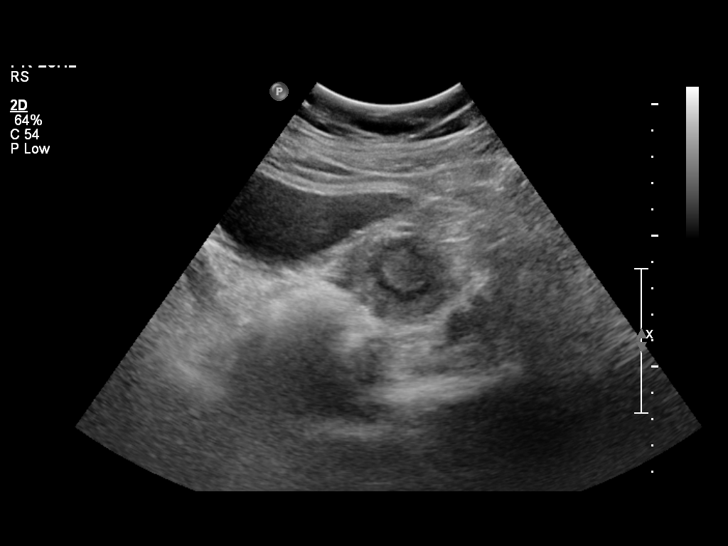
[im 8/43]
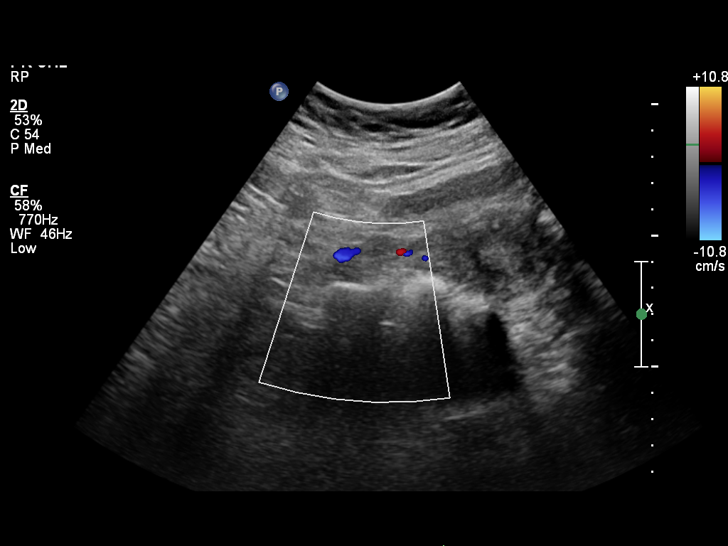
[im 11/43]
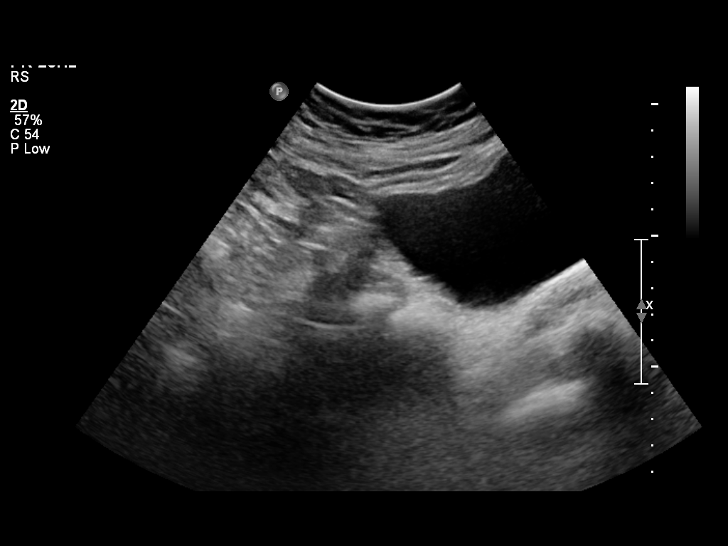
[im 15/43]
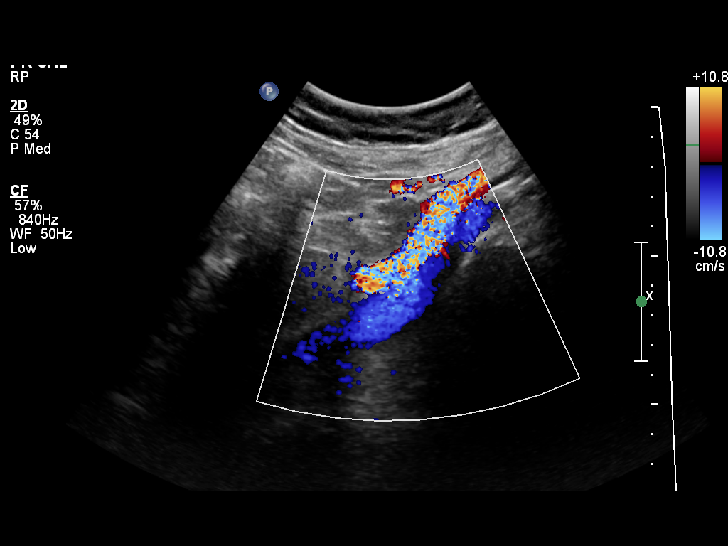
[im 16/43]
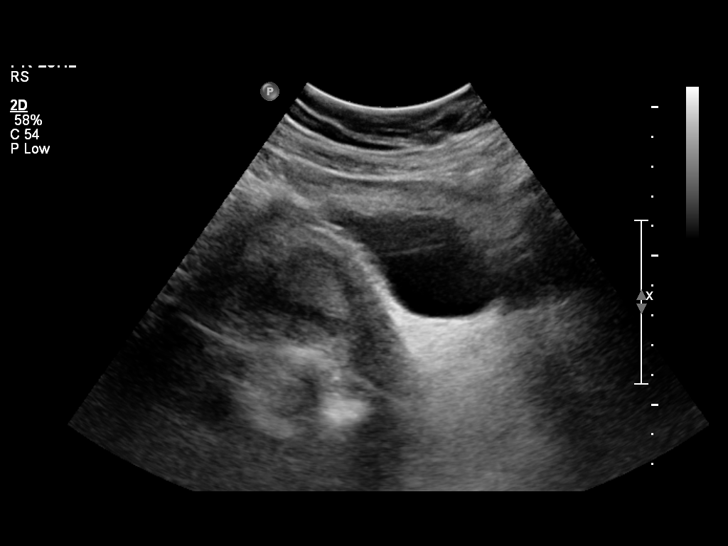
[im 20/43]
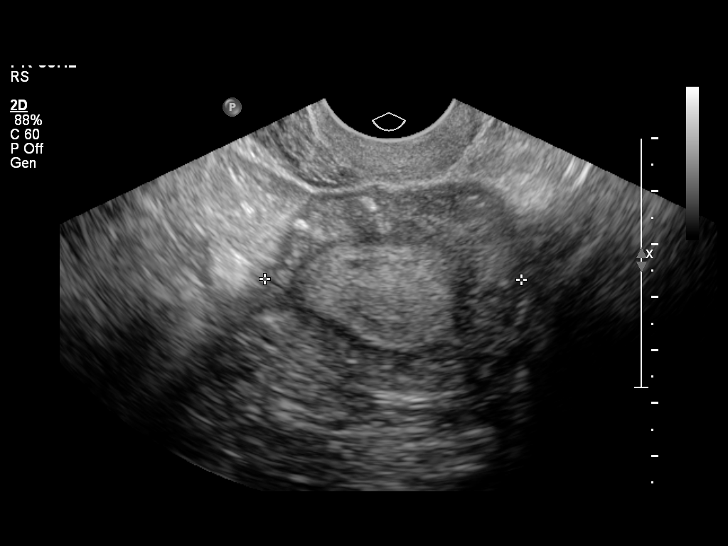
[im 23/43]
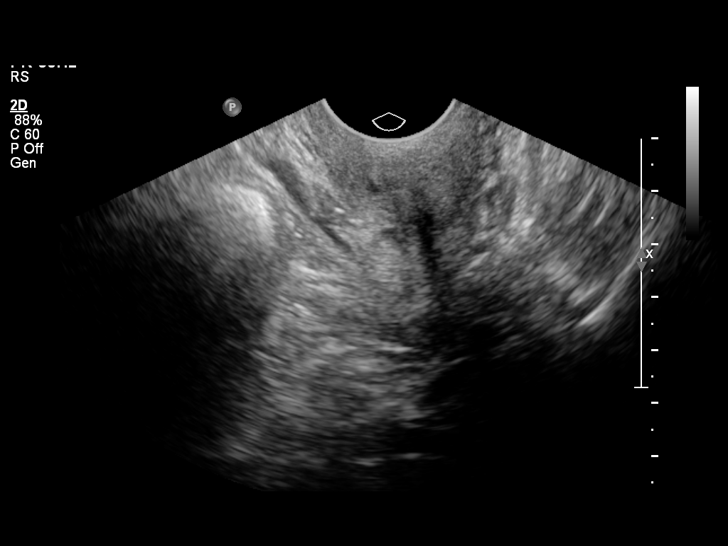
[im 27/43]
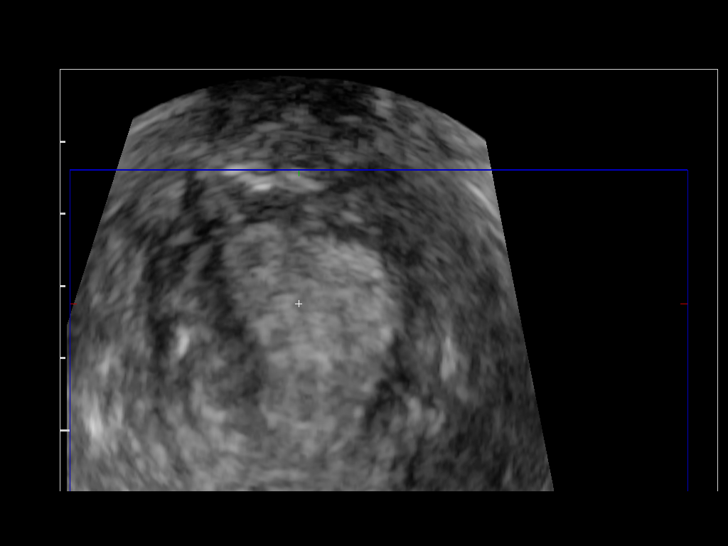
[im 29/43]
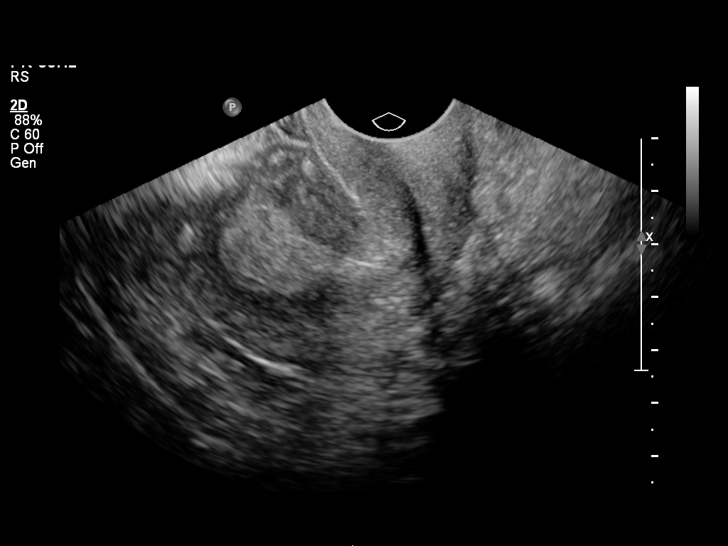
[im 32/43]
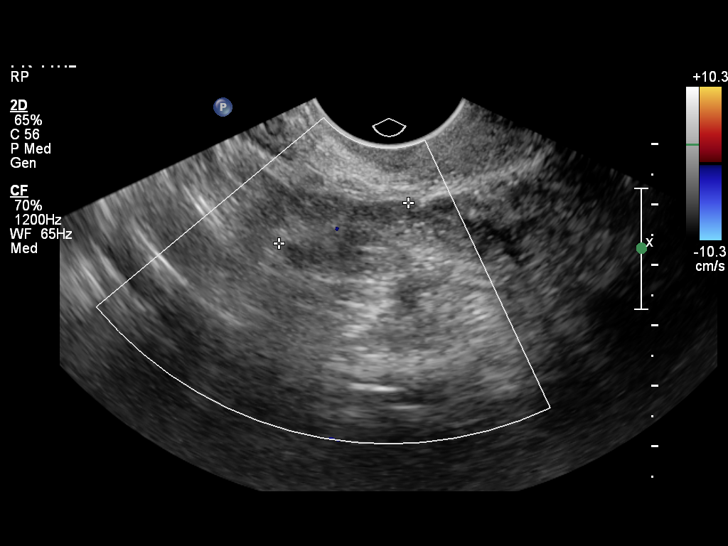
[im 36/43]
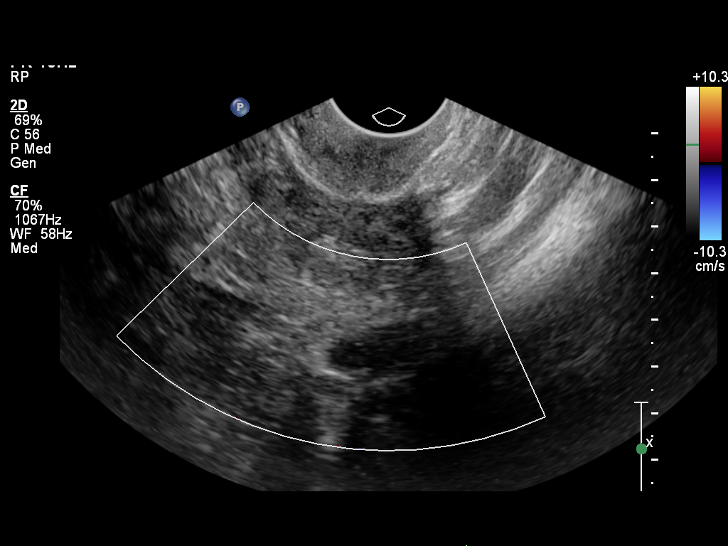
[im 39/43]
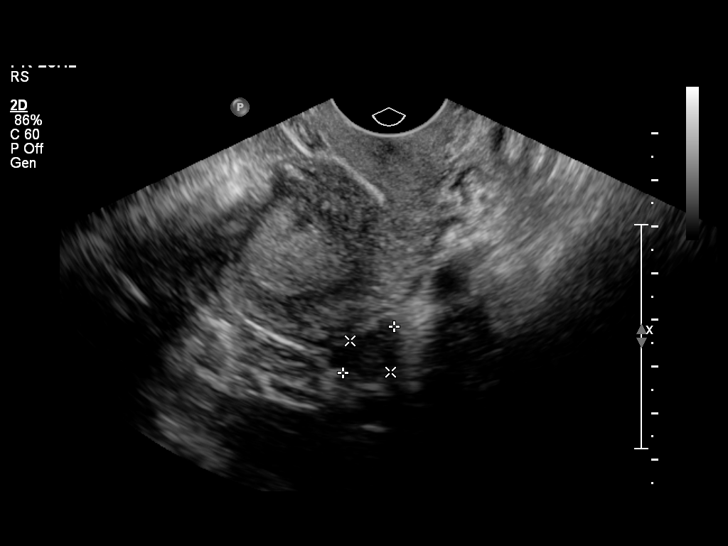
[im 43/43]
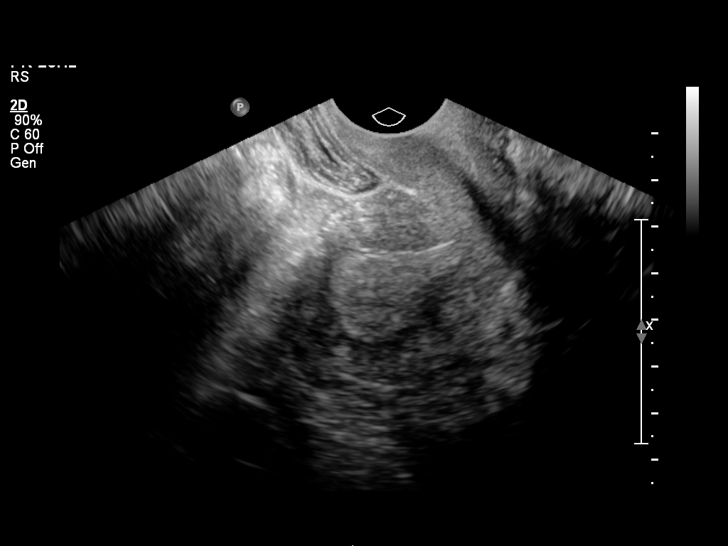

[14 of 25 positions shown; findings below may reference images not displayed]

FINDINGS: Uterus: 6.2 x 3.9 x 4.8 cm.

Endometrium: Relatively homogeneously thickened.  Maximally 2.2 cm.

Right ovary:  1.7 x 0.7 x 2.2 cm. Normal in morphology.

Left ovary: 3.4 x 2.3 x 1.9 cm.  Possible residual follicle at
cm.

Both ovaries poorly visualized secondary to patient body habitus.

Other findings: No free fluid
IMPRESSION: Endometrial thickening.  In this patient with post menopausal
bleeding, considerations include endometrial hyperplasia or
carcinoma.  Sonohysterogram or tissue sampling should be
considered..

Decreased sensitivity and specificity exam due to technique related
factors, as described above.

## 2012-11-29 ENCOUNTER — Ambulatory Visit: Payer: Self-pay | Admitting: Obstetrics & Gynecology

## 2012-12-07 ENCOUNTER — Ambulatory Visit: Admit: 2012-12-07 | Payer: Self-pay | Admitting: Obstetrics & Gynecology

## 2012-12-07 SURGERY — DILATATION AND CURETTAGE /HYSTEROSCOPY
Anesthesia: Choice | Site: Vagina

## 2014-01-28 ENCOUNTER — Encounter: Payer: Self-pay | Admitting: Obstetrics & Gynecology

## 2014-01-28 ENCOUNTER — Other Ambulatory Visit (HOSPITAL_COMMUNITY)
Admission: RE | Admit: 2014-01-28 | Discharge: 2014-01-28 | Disposition: A | Discharge status: Home / Self Care | Payer: Medicare Other | Source: Ambulatory Visit | Attending: Obstetrics & Gynecology | Admitting: Obstetrics & Gynecology

## 2014-01-28 ENCOUNTER — Ambulatory Visit (INDEPENDENT_AMBULATORY_CARE_PROVIDER_SITE_OTHER): Payer: Medicare Other | Admitting: Obstetrics & Gynecology

## 2014-01-28 VITALS — BP 156/93 | HR 82 | Temp 97.5°F | Ht 62.0 in | Wt 213.4 lb

## 2014-01-28 DIAGNOSIS — Z0000 Encounter for general adult medical examination without abnormal findings: Secondary | ICD-10-CM

## 2014-01-28 DIAGNOSIS — Z01419 Encounter for gynecological examination (general) (routine) without abnormal findings: Secondary | ICD-10-CM

## 2014-01-28 DIAGNOSIS — Z124 Encounter for screening for malignant neoplasm of cervix: Secondary | ICD-10-CM | POA: Insufficient documentation

## 2014-01-28 DIAGNOSIS — Z1151 Encounter for screening for human papillomavirus (HPV): Secondary | ICD-10-CM

## 2014-01-28 NOTE — Patient Instructions (Signed)
Mammography Mammography is an X-ray of the breasts to look for changes that are not normal. The X-ray image is called a mammogram. This procedure can screen for breast cancer, can detect cancer early, and can diagnose cancer.  LET YOUR CAREGIVER KNOW ABOUT:  Breast implants.  Previous breast disease, biopsy, or surgery.  If you are breastfeeding.  Medicines taken, including vitamins, herbs, eyedrops, over-the-counter medicines, and creams.  Use of steroids (by mouth or creams).  Possibility of pregnancy, if this applies. RISKS AND COMPLICATIONS  Exposure to radiation, but at very low levels.  The results may be misinterpreted.  The results may not be accurate.  Mammography may lead to further tests.  Mammography may not catch certain cancers. BEFORE THE PROCEDURE  Schedule your test about 7 days after your menstrual period. This is when your breasts are the least tender and have signs of hormone changes.  If you have had a mammography done at a different facility in the past, get the mammogram X-rays or have them sent to your current exam facility in order to compare them.  Wash your breasts and under your arms the day of the test.  Do not wear deodorants, perfumes, or powders anywhere on your body.  Wear clothes that you can change in and out of easily. PROCEDURE Relax as much as possible during the test. Any discomfort during the test will be very brief. The test should take less than 30 minutes. The following will happen:  You will undress from the waist up and put on a gown.  You will stand in front of the X-ray machine.  Each breast will be placed between 2 plastic or glass plates. The plates will compress your breast for a few seconds.  X-rays will be taken from different angles of the breast. AFTER THE PROCEDURE  The mammogram will be examined.  Depending on the quality of the images, you may need to repeat certain parts of the test.  Ask when your test  results will be ready. Make sure you get your test results.  You may resume normal activities. Document Released: 07/09/2000 Document Revised: 10/04/2011 Document Reviewed: 05/02/2011 ExitCare Patient Information 2015 [employer]. This information is not intended to replace advice given to you by your health care provider. Make sure you discuss any questions you have with your health care provider.  

## 2014-01-28 NOTE — Progress Notes (Signed)
Patient ID: Veronica Hudson, female   DOB: [birth year], 63 y.o.   MRN: [id number] Subjective:     Veronica Hudson is a 63 y.o. female here for a routine exam.  Current complaints: none. Prev eval for PMPB- has had no reoccur ance since her last visit here.   Gynecologic History No LMP recorded. Patient is postmenopausal. Contraception: post menopausal status Last Pap: unknown. Results were: normal per pt Last mammogram: has only had twice last time unknonw. Results were: normal  Obstetric History OB History  Gravida Para Term Preterm AB SAB TAB Ectopic Multiple Living  8 5   3 3    5     # Outcome Date GA Lbr Len/2nd Weight Sex Delivery Anes PTL Lv  8 SAB           7 SAB           6 SAB           5 PAR           4 PAR           3 PAR           2 PAR           1 PAR                The following portions of the patient's history were reviewed and updated as appropriate: allergies, current medications, past family history, past medical history, past social history, past surgical history and problem list.  Review of Systems Pertinent items are noted in HPI.    Objective:    BP 156/93  Pulse 82  Temp(Src) 97.5 F (36.4 C)  Ht 5\' 2"  (1.575 m)  Wt 213 lb 6.4 oz (96.798 kg)  BMI 39.02 kg/m2  General Appearance:    Alert, cooperative, no distress, appears stated age                 Neck:   Supple, symmetrical, trachea midline, no adenopathy;    thyroid:  no enlargement/tenderness/nodules; no carotid   bruit or JVD  Back:     Symmetric, no curvature, ROM normal, no CVA tenderness  Lungs:     Clear to auscultation bilaterally, respirations unlabored  Chest Wall:    No tenderness or deformity   Heart:    Regular rate and rhythm, S1 and S2 normal, no murmur, rub   or gallop  Breast Exam:    No tenderness, masses, or nipple abnormality  Abdomen:     Soft, obese, non-tender, bowel sounds active all four quadrants, no masses palpable  , no organomegaly  Genitalia:    Normal  female without lesion, discharge or tenderness     Extremities:   Extremities normal, atraumatic, no cyanosis or edema  Pulses:   2+ and symmetric all extremities  Skin:   Skin color, texture, turgor normal, no rashes or lesions            Assessment:    Healthy female exam.  Post menopausal- no bleeding since 2013   Plan:    Mammogram ordered. Follow up in: 1 year.   F/u PAP and HPV

## 2014-01-29 ENCOUNTER — Other Ambulatory Visit: Payer: Self-pay | Admitting: Obstetrics & Gynecology

## 2014-01-29 ENCOUNTER — Ambulatory Visit (HOSPITAL_COMMUNITY)
Admission: RE | Admit: 2014-01-29 | Discharge: 2014-01-29 | Disposition: A | Discharge status: Home / Self Care | Payer: Medicare Other | Source: Ambulatory Visit | Attending: Obstetrics & Gynecology | Admitting: Obstetrics & Gynecology

## 2014-01-29 DIAGNOSIS — Z1231 Encounter for screening mammogram for malignant neoplasm of breast: Secondary | ICD-10-CM | POA: Insufficient documentation

## 2014-01-29 DIAGNOSIS — Z0000 Encounter for general adult medical examination without abnormal findings: Secondary | ICD-10-CM

## 2014-01-29 LAB — CYTOLOGY - PAP

## 2014-05-27 ENCOUNTER — Encounter: Payer: Self-pay | Admitting: Obstetrics & Gynecology

## 2024-04-25 DEATH — deceased
